# Patient Record
Sex: Male | Born: 1980 | Race: Black or African American | Hispanic: No | Marital: Single | Smoking: Current every day smoker
Health system: Southern US, Community
[De-identification: ages and names within clinical notes are randomized; demographics above are authoritative.]

## PROBLEM LIST (undated history)

## (undated) HISTORY — PX: APPENDECTOMY: SHX54

---

## 2004-10-05 ENCOUNTER — Emergency Department: Payer: Self-pay | Admitting: Unknown Physician Specialty

## 2006-07-18 ENCOUNTER — Emergency Department: Payer: Self-pay

## 2006-07-18 ENCOUNTER — Other Ambulatory Visit: Payer: Self-pay

## 2007-10-01 ENCOUNTER — Encounter (INDEPENDENT_AMBULATORY_CARE_PROVIDER_SITE_OTHER): Payer: Self-pay | Admitting: Internal Medicine

## 2007-10-01 ENCOUNTER — Ambulatory Visit: Payer: Self-pay | Admitting: Family Medicine

## 2007-10-01 DIAGNOSIS — B353 Tinea pedis: Secondary | ICD-10-CM

## 2007-10-06 LAB — CONVERTED CEMR LAB
Basophils Absolute: 0 10*3/uL (ref 0.0–0.1)
Calcium: 9.5 mg/dL (ref 8.4–10.5)
Chloride: 106 meq/L (ref 96–112)
Cholesterol: 200 mg/dL (ref 0–200)
Eosinophils Absolute: 0.2 10*3/uL (ref 0.0–0.6)
GFR calc non Af Amer: 87 mL/min
HDL: 36.1 mg/dL — ABNORMAL LOW (ref >39.0)
MCHC: 34.7 g/dL (ref 30.0–36.0)
MCV: 96.6 fL (ref 78.0–100.0)
Neutrophils Relative %: 43.5 % (ref 43.0–77.0)
Platelets: 171 10*3/uL (ref 150–400)
RBC: 4.64 M/uL (ref 4.22–5.81)
Sodium: 140 meq/L (ref 135–145)
Total CHOL/HDL Ratio: 5.5
Triglycerides: 86 mg/dL (ref 0–149)

## 2007-10-07 ENCOUNTER — Encounter (INDEPENDENT_AMBULATORY_CARE_PROVIDER_SITE_OTHER): Payer: Self-pay | Admitting: Internal Medicine

## 2007-10-07 LAB — CONVERTED CEMR LAB

## 2007-12-13 ENCOUNTER — Emergency Department: Payer: Self-pay | Admitting: Emergency Medicine

## 2009-08-25 ENCOUNTER — Inpatient Hospital Stay: Payer: Self-pay | Admitting: Surgery

## 2013-08-09 ENCOUNTER — Emergency Department: Payer: Self-pay | Admitting: Emergency Medicine

## 2013-08-23 ENCOUNTER — Emergency Department: Payer: Self-pay | Admitting: Emergency Medicine

## 2013-11-22 ENCOUNTER — Emergency Department: Payer: Self-pay | Admitting: Emergency Medicine

## 2013-11-22 LAB — RAPID INFLUENZA A&B ANTIGENS

## 2013-11-22 LAB — BASIC METABOLIC PANEL
Anion Gap: 4 — ABNORMAL LOW (ref 7–16)
Co2: 27 mmol/L (ref 21–32)
EGFR (African American): >60
Osmolality: 272 (ref 275–301)
Potassium: 3.6 mmol/L (ref 3.5–5.1)

## 2013-11-22 LAB — CBC
HCT: 42.6 % (ref 40.0–52.0)
MCH: 32.2 pg (ref 26.0–34.0)
MCV: 94 fL (ref 80–100)
RDW: 13.2 % (ref 11.5–14.5)
WBC: 7.3 10*3/uL (ref 3.8–10.6)

## 2013-11-22 LAB — TROPONIN I: Troponin-I: <0.02 ng/mL

## 2014-11-14 ENCOUNTER — Ambulatory Visit: Payer: Self-pay | Admitting: Physician Assistant

## 2014-11-24 ENCOUNTER — Ambulatory Visit: Payer: Self-pay | Admitting: Family Medicine

## 2016-02-20 ENCOUNTER — Ambulatory Visit
Admission: EM | Admit: 2016-02-20 | Discharge: 2016-02-20 | Disposition: A | Discharge status: Home / Self Care | Payer: Self-pay | Attending: Family Medicine | Admitting: Family Medicine

## 2016-02-20 DIAGNOSIS — B349 Viral infection, unspecified: Secondary | ICD-10-CM

## 2016-02-20 LAB — RAPID INFLUENZA A&B ANTIGENS
Influenza A (ARMC): NEGATIVE
Influenza B (ARMC): NEGATIVE

## 2016-02-20 MED ORDER — ONDANSETRON 4 MG PO TBDP: 4.0000 mg | ORAL_TABLET | Freq: Three times a day (TID) | ORAL | Status: DC | PRN

## 2016-02-20 MED ORDER — OSELTAMIVIR PHOSPHATE 75 MG PO CAPS: 75.0000 mg | ORAL_CAPSULE | Freq: Two times a day (BID) | ORAL | Status: DC

## 2016-02-20 NOTE — ED Provider Notes (Signed)
CSN: [id number]     Arrival date & time 02/20/16  1133 History   First MD Initiated Contact with Patient 02/20/16 1149     Chief Complaint  Patient presents with  . URI   (Consider location/radiation/quality/duration/timing/severity/associated sxs/prior Treatment) HPI: Patient presents today with symptoms of myalgias, vomiting, diarrhea, fever, cough for the last 3 days. He denies any chest pain, shortness of breath, abdominal pain, severe headache, rash, sore throat. He has taken over-the-counter medications for his symptoms. He states that he has likely been around people with the flu virus. No recent travel. Has been able to keep some fluids down.   History reviewed. No pertinent past medical history. Past Surgical History  Procedure Laterality Date  . Appendectomy     History reviewed. No pertinent family history. Social History  Substance Use Topics  . Smoking status: Current Every Day Smoker  . Smokeless tobacco: Never Used  . Alcohol Use: Yes    Review of Systems: Negative except mentioned above.  Allergies  Penicillins  Home Medications   Prior to Admission medications   Medication Sig Start Date End Date Taking? Authorizing Provider  ondansetron (ZOFRAN ODT) 4 MG disintegrating tablet Take 1 tablet (4 mg total) by mouth every 8 (eight) hours as needed for nausea or vomiting. 02/20/16   Jolene ProvostKirtida Masami Plata, MD  oseltamivir (TAMIFLU) 75 MG capsule Take 1 capsule (75 mg total) by mouth every 12 (twelve) hours. 02/20/16   Jolene ProvostKirtida Belkis Norbeck, MD   Meds Ordered and Administered this Visit  Medications - No data to display  BP 114/73 mmHg  Pulse 86  Temp(Src) 98.9 F (37.2 C) (Oral)  Resp 18  Ht 5\' 8"  (1.727 m)  Wt 170 lb (77.111 kg)  BMI 25.85 kg/m2  SpO2 98% No data found.   Physical Exam:  GENERAL: NAD HEENT: no pharyngeal erythema, no exudate, no erythema of TMs, no cervical LAD RESP: CTA B CARD: RRR ABD: +BS, NT/ND, no rebound or guarding appreciated  NEURO: CN  II-XII grossly intact   ED Course  Procedures (including critical care time)  Labs Review Labs Reviewed  RAPID INFLUENZA A&B ANTIGENS ([facility] ONLY)    Imaging Review No results found.    MDM   1. Viral illness   Influenza screen was negative in the office today. But given symptoms I have prescribed Tamiflu if the patient wants to take it. Have also sent over a prescription for Zofran ODT. Encouraged hydration and rest. Supportive care with other over-the-counter medications discussed. If unable to tolerate fluids or food for another day I would recommend that he seek immediate medical attention at the ER if needed. Work excuse given for today and tomorrow.    Jolene ProvostKirtida Lailah Marcelli, MD 02/20/16 [phone]

## 2016-02-20 NOTE — ED Notes (Signed)
Patient c/o body aches, vomiting, fever, and cough which started this past Saturday morning.

## 2016-02-27 ENCOUNTER — Emergency Department
Admission: EM | Admit: 2016-02-27 | Discharge: 2016-02-27 | Disposition: A | Discharge status: Home / Self Care | Payer: Self-pay | Attending: Emergency Medicine | Admitting: Emergency Medicine

## 2016-02-27 ENCOUNTER — Encounter: Payer: Self-pay | Admitting: Medical Oncology

## 2016-02-27 ENCOUNTER — Emergency Department: Payer: Self-pay

## 2016-02-27 DIAGNOSIS — F17200 Nicotine dependence, unspecified, uncomplicated: Secondary | ICD-10-CM | POA: Insufficient documentation

## 2016-02-27 DIAGNOSIS — J159 Unspecified bacterial pneumonia: Secondary | ICD-10-CM | POA: Insufficient documentation

## 2016-02-27 DIAGNOSIS — J189 Pneumonia, unspecified organism: Secondary | ICD-10-CM

## 2016-02-27 DIAGNOSIS — Z880 Allergy status to penicillin: Secondary | ICD-10-CM | POA: Insufficient documentation

## 2016-02-27 DIAGNOSIS — I319 Disease of pericardium, unspecified: Secondary | ICD-10-CM | POA: Insufficient documentation

## 2016-02-27 LAB — CBC WITH DIFFERENTIAL/PLATELET
BASOS ABS: 0 10*3/uL (ref 0–0.1)
Basophils Relative: 0 %
EOS ABS: 0.2 10*3/uL (ref 0–0.7)
Eosinophils Relative: 2 %
HCT: 43.4 % (ref 40.0–52.0)
HEMOGLOBIN: 14.7 g/dL (ref 13.0–18.0)
LYMPHS PCT: 23 %
Lymphs Abs: 2.6 10*3/uL (ref 1.0–3.6)
MCH: 31.8 pg (ref 26.0–34.0)
MCHC: 34 g/dL (ref 32.0–36.0)
MCV: 93.6 fL (ref 80.0–100.0)
Monocytes Absolute: 2.6 10*3/uL — ABNORMAL HIGH (ref 0.2–1.0)
Monocytes Relative: 23 %
NEUTROS ABS: 6 10*3/uL (ref 1.4–6.5)
NEUTROS PCT: 52 %
Platelets: 253 10*3/uL (ref 150–440)
RBC: 4.63 MIL/uL (ref 4.40–5.90)
RDW: 12.8 % (ref 11.5–14.5)
WBC: 11.4 10*3/uL — AB (ref 3.8–10.6)

## 2016-02-27 LAB — BASIC METABOLIC PANEL
Anion gap: 6 (ref 5–15)
BUN: 15 mg/dL (ref 6–20)
CO2: 25 mmol/L (ref 22–32)
CREATININE: 1.23 mg/dL (ref 0.61–1.24)
Calcium: 9.1 mg/dL (ref 8.9–10.3)
Chloride: 105 mmol/L (ref 101–111)
GFR calc Af Amer: >60 mL/min (ref >60)
Glucose, Bld: 93 mg/dL (ref 65–99)
POTASSIUM: 4.5 mmol/L (ref 3.5–5.1)
SODIUM: 136 mmol/L (ref 135–145)

## 2016-02-27 LAB — TROPONIN I

## 2016-02-27 MED ORDER — KETOROLAC TROMETHAMINE 30 MG/ML IJ SOLN
30.0000 mg | Freq: Once | INTRAMUSCULAR | Status: AC
  Administered 2016-02-27: 30 mg via INTRAVENOUS
  Filled 2016-02-27: qty 1

## 2016-02-27 MED ORDER — SODIUM CHLORIDE 0.9 % IV BOLUS (SEPSIS)
1000.0000 mL | Freq: Once | INTRAVENOUS | Status: AC
  Administered 2016-02-27: 1000 mL via INTRAVENOUS

## 2016-02-27 MED ORDER — AZITHROMYCIN 250 MG PO TABS: ORAL_TABLET | ORAL | Status: AC

## 2016-02-27 MED ORDER — IBUPROFEN 600 MG PO TABS: 600.0000 mg | ORAL_TABLET | Freq: Four times a day (QID) | ORAL | Status: DC

## 2016-02-27 NOTE — Discharge Instructions (Signed)
Pericarditis Pericarditis is swelling (inflammation) of the pericardium. The pericardium is a thin, double-layered, fluid-filled tissue sac that surrounds the heart. The purpose of the pericardium is to contain the heart in the chest cavity and keep the heart from overexpanding. Different types of pericarditis can occur, such as:  Acute pericarditis. Inflammation can develop suddenly in acute pericarditis.  Chronic pericarditis. Inflammation develops gradually and is long-lasting in chronic pericarditis.  Constrictive pericarditis. In this type of pericarditis, the layers of the pericardium stiffen and develop scar tissue. The scar tissue thickens and sticks together. This makes it difficult for the heart to pump and work as it normally does. CAUSES  Pericarditis can be caused from different conditions, such as:  A bacterial, fungal or viral infection.  After a heart attack (myocardial infarction).  After open-heart surgery (coronary bypass graft surgery).  Auto-immune conditions such as lupus, rheumatoid arthritis or scleroderma.  Kidney failure.  Low thyroid condition (hypothyroidism).  Cancer from another part of the body that has spread (metastasized) to the pericardium.  Chest injury or trauma.  After radiation treatment.  Certain medicines. SYMPTOMS  Symptoms of pericarditis can include:  Chest pain. Chest pain symptoms may increase when laying down and may be relieved when sitting up and leaning forward.  A chronic, dry cough.  Heart palpitations. These may feel like rapid, fluttering or pounding heart beats.  Chest pain may be worse when swallowing.  Dizziness or fainting.  Tiredness, fatigue or lethargy.  Fever. DIAGNOSIS  Pericarditis is diagnosed by the following:  A physical exam. A heart sound called a pericardial friction rub may be heard when your caregiver listens to your heart.  Blood work. Blood may be drawn to check for an infection and to look at  your blood chemistry.  Electrocardiography. During electrocardiography your heart's electrical activity is monitored and recorded with a tracing on paper (electrocardiogram [ECG]).  Echocardiography.  Computed tomography (CT).  Magnetic resonance image (MRI). TREATMENT  To treat pericarditis, it is important to know the cause of it. The cause of pericarditis determines the treatment.   If the cause of pericarditis is due to an infection, treatment is based on the type of infection. If an infection is suspected in the pericardial fluid, a procedure called a pericardial fluid culture and biopsy may be done. This takes a sample of the pericardial fluid. The sample is sent to a lab which runs tests on the pericardial fluid to check for an infection.  If the autoimmune disease is the cause, treatment of the autoimmune condition will help improve the pericarditis.  If the cause of pericarditis is not known, anti-inflammatory medicines may be used to help decrease the inflammation.  Surgery may be needed. The following are types of surgeries or procedures that may be done to treat pericarditis:  Pericardial window. A pericardial window makes a cut (incision) into the pericardial sac. This allows excess fluid in the pericardium to drain.  Pericardiocentesis. A pericardiocentesis is also known as a pericardial tap. This procedure uses a needle that is guided by X-ray to drain (aspirate) excess fluid from the pericardium.  Pericardiectomy. A pericardiectomy removes part or all of the pericardium. HOME CARE INSTRUCTIONS   Do not smoke. If you smoke, quit. Your caregiver can help you quit smoking.  Maintain a healthy weight.  Follow an exercise program as directed by your health care provider. You may need to limit your exercising until your symptoms go away.  If you drink alcohol, do so in moderation.  Eat a heart healthy diet. A registered dietitian can help you learn about healthy food  choices.  Keep a list of all your medicines with you at all times. Include the name, dose, how often it is taken and how it is taken. SEEK IMMEDIATE MEDICAL CARE IF:   You have chest pain or feelings of chest pressure.  You have sweating (diaphoresis) when at rest.  You have irregular heartbeats (palpitations).  You have rapid, racing heart beats.  You have unexplained fainting episodes.  You feel sick to your stomach (nausea) or vomiting without cause.  You have unexplained weakness. If you develop any of the symptoms which originally made you seek care, call for local emergency medical help. Do not drive yourself to the hospital.   This information is not intended to replace advice given to you by your health care provider. Make sure you discuss any questions you have with your health care provider.   Document Released: 05/20/2001 Document Revised: 04/10/2015 Document Reviewed: 06/06/2015 Elsevier Interactive Patient Education 2016 Elsevier Inc.  Community-Acquired Pneumonia, Adult Pneumonia is an infection of the lungs. One type of pneumonia can happen while a person is in a hospital. A different type can happen when a person is not in a hospital (community-acquired pneumonia). It is easy for this kind to spread from person to person. It can spread to you if you breathe near an infected person who coughs or sneezes. Some symptoms include:  A dry cough.  A wet (productive) cough.  Fever.  Sweating.  Chest pain. HOME CARE  Take over-the-counter and prescription medicines only as told by your doctor.  Only take cough medicine if you are losing sleep.  If you were prescribed an antibiotic medicine, take it as told by your doctor. Do not stop taking the antibiotic even if you start to feel better.  Sleep with your head and neck raised (elevated). You can do this by putting a few pillows under your head, or you can sleep in a recliner.  Do not use tobacco products. These  include cigarettes, chewing tobacco, and e-cigarettes. If you need help quitting, ask your doctor.  Drink enough water to keep your pee (urine) clear or pale yellow. A shot (vaccine) can help prevent pneumonia. Shots are often suggested for:  People older than 33 years of age.  People older than 33 years of age:  Who are having cancer treatment.  Who have long-term (chronic) lung disease.  Who have problems with their body's defense system (immune system). You may also prevent pneumonia if you take these actions:  Get the flu (influenza) shot every year.  Go to the dentist as often as told.  Wash your hands often. If soap and water are not available, use hand sanitizer. GET HELP IF:  You have a fever.  You lose sleep because your cough medicine does not help. GET HELP RIGHT AWAY IF:  You are short of breath and it gets worse.  You have more chest pain.  Your sickness gets worse. This is very serious if:  You are an older adult.  Your body's defense system is weak.  You cough up blood.   This information is not intended to replace advice given to you by your health care provider. Make sure you discuss any questions you have with your health care provider.   Document Released: 05/12/2008 Document Revised: 08/15/2015 Document Reviewed: 03/21/2015 Elsevier Interactive Patient Education [employer]2016 Elsevier Inc.

## 2016-02-27 NOTE — ED Provider Notes (Signed)
[facility] Emergency Department Provider Note  ____________________________________________  Time seen: Approximately 10:05 AM  I have reviewed the triage vital signs and the nursing notes.   HISTORY  Chief Complaint Chest Pain and Shortness of Breath    HPI Malik Hoffman is a 35 y.o. male without any chronic medical conditions was presenting to the emergency department today with left-sided, sharp chest pain which began yesterday and worsened this morning. He says he has had a cough over the course of the last week. Denies any fever over the last 2 days. However, has complained of fever, vomiting and diarrhea and myalgias on the 15th. He says that the chest pain as a 10 out of 10, sharp in the left side of the chest. It is associated with shortness of breath but no vomiting or nausea. He says it worsens with deep breathing. His that he is a smoking history but denies any drug use. Asked and Specifically denied cocaine. Denies any history of cardiac disease in his family. Denies any radiation of the pain.  History reviewed. No pertinent past medical history.  Patient Active Problem List   Diagnosis Date Noted  . TINEA PEDIS 10/01/2007    Past Surgical History  Procedure Laterality Date  . Appendectomy      Current Outpatient Rx  Name  Route  Sig  Dispense  Refill  . ondansetron (ZOFRAN ODT) 4 MG disintegrating tablet   Oral   Take 1 tablet (4 mg total) by mouth every 8 (eight) hours as needed for nausea or vomiting.   20 tablet   0   . oseltamivir (TAMIFLU) 75 MG capsule   Oral   Take 1 capsule (75 mg total) by mouth every 12 (twelve) hours.   10 capsule   0     Allergies Penicillins  No family history on file.  Social History Social History  Substance Use Topics  . Smoking status: Current Every Day Smoker  . Smokeless tobacco: Never Used  . Alcohol Use: Yes    Review of Systems Constitutional: No fever/chills Eyes: No  visual changes. ENT: No sore throat. Cardiovascular: As above Respiratory: As above Gastrointestinal: No abdominal pain.  No nausea, no vomiting.  No diarrhea.  No constipation. Genitourinary: Negative for dysuria. Musculoskeletal: Negative for back pain. Skin: Negative for rash. Neurological: Negative for headaches, focal weakness or numbness.  10-point ROS otherwise negative.  ____________________________________________   PHYSICAL EXAM:  VITAL SIGNS: ED Triage Vitals  Enc Vitals Group     BP 02/27/16 0957 119/89 mmHg     Pulse Rate 02/27/16 0953 86     Resp 02/27/16 0953 20     Temp 02/27/16 0954 98.2 F (36.8 C)     Temp Source 02/27/16 0954 Oral     SpO2 02/27/16 0953 99 %     Weight 02/27/16 0953 180 lb (81.647 kg)     Height 02/27/16 0953 5\' 8"  (1.727 m)     Head Cir --      Peak Flow --      Pain Score 02/27/16 0953 10     Pain Loc --      Pain Edu? --      Excl. in GC? --     Constitutional: Alert and oriented. Appears uncomfortable but not distressed. Eyes: Conjunctivae are normal. PERRL. EOMI. Head: Atraumatic. Nose: No congestion/rhinnorhea. Mouth/Throat: Mucous membranes are moist.  Neck: No stridor.   Cardiovascular: Normal rate, regular rhythm. Grossly normal heart sounds.  Good  peripheral circulation. Reproducible tenderness to palpation to the left side of the chest. Respiratory: Normal respiratory effort.  No retractions. Lungs CTAB. Gastrointestinal: Soft and nontender. No distention. No CVA tenderness. Musculoskeletal: No lower extremity tenderness nor edema.  No joint effusions. Neurologic:  Normal speech and language. No gross focal neurologic deficits are appreciated.  Skin:  Skin is warm, dry and intact. No rash noted. Psychiatric: Mood and affect are normal. Speech and behavior are normal.  ____________________________________________   LABS (all labs ordered are listed, but only abnormal results are displayed)  Labs Reviewed  CBC WITH  DIFFERENTIAL/PLATELET - Abnormal; Notable for the following:    WBC 11.4 (*)    Monocytes Absolute 2.6 (*)    All other components within normal limits  TROPONIN I  BASIC METABOLIC PANEL   ____________________________________________  EKG  ED ECG REPORT I, Arelia Longest, the attending physician, personally viewed and interpreted this ECG.   Date: 02/27/2016  EKG Time: 0954  Rate: 82  Rhythm: normal sinus rhythm  Axis: normal  Intervals:none  ST&T Change: Diffuse ST elevation with a concave morphology consistent with pericarditis.  ____________________________________________  RADIOLOGY   IMPRESSION: No pulmonary edema. There is subtle patchy airspace opacification in lingula. Early infiltrate cannot be excluded. Follow-up to resolution is recommended.   Electronically Signed By: Natasha Mead M.D. ____________________________________________   PROCEDURES   ____________________________________________   INITIAL IMPRESSION / ASSESSMENT AND PLAN / ED COURSE  Pertinent labs & imaging results that were available during my care of the patient were reviewed by me and considered in my medical decision making (see chart for details).  ----------------------------------------- 10:21 AM on 02/27/2016 -----------------------------------------  Discussed the case with Dr.Gollan of cardiology who agrees with the EKG being consistent with pericarditis. The patient has a very good story for pericarditis given his recent viral illness. We'll give anti-inflammatories and fluid and reassess. We'll check basic labs as well as a troponin.  ----------------------------------------- 12:09 PM on 02/27/2016 -----------------------------------------  Patient with pain relief at this time. Review the labs as well as imaging with the patient. Will be discharged home with antibiotics for possible early pneumonia. Feel the primary reason for his visit today and chest pain is  pericarditis. We discussed return precautions including any worsening or concerning symptoms, especially increased pain or shortness of breath. He'll be discharged home with ibuprofen as well as doxycycline. ____________________________________________   FINAL CLINICAL IMPRESSION(S) / ED DIAGNOSES  Pericarditis. CAP.    Myrna Blazer, MD 02/27/16 [phone]

## 2016-02-27 NOTE — ED Notes (Signed)
Pt reports he has had a cold x 1 week with cough. Pt reports yesterday he began having sob and pain to left side of chest with breathing.

## 2017-04-06 ENCOUNTER — Emergency Department
Admission: EM | Admit: 2017-04-06 | Discharge: 2017-04-06 | Disposition: A | Discharge status: Home / Self Care | Payer: Self-pay | Attending: Emergency Medicine | Admitting: Emergency Medicine

## 2017-04-06 DIAGNOSIS — L301 Dyshidrosis [pompholyx]: Secondary | ICD-10-CM | POA: Insufficient documentation

## 2017-04-06 DIAGNOSIS — F17200 Nicotine dependence, unspecified, uncomplicated: Secondary | ICD-10-CM | POA: Insufficient documentation

## 2017-04-06 LAB — BASIC METABOLIC PANEL
ANION GAP: 7 (ref 5–15)
BUN: 14 mg/dL (ref 6–20)
CALCIUM: 9.3 mg/dL (ref 8.9–10.3)
CHLORIDE: 106 mmol/L (ref 101–111)
CO2: 27 mmol/L (ref 22–32)
Creatinine, Ser: 1.53 mg/dL — ABNORMAL HIGH (ref 0.61–1.24)
GFR calc Af Amer: >60 mL/min (ref >60)
GFR calc non Af Amer: 58 mL/min — ABNORMAL LOW (ref >60)
GLUCOSE: 95 mg/dL (ref 65–99)
POTASSIUM: 4.2 mmol/L (ref 3.5–5.1)
Sodium: 140 mmol/L (ref 135–145)

## 2017-04-06 LAB — CBC WITH DIFFERENTIAL/PLATELET
BASOS PCT: 1 %
Basophils Absolute: 0 10*3/uL (ref 0–0.1)
EOS PCT: 3 %
Eosinophils Absolute: 0.3 10*3/uL (ref 0–0.7)
HCT: 43.6 % (ref 40.0–52.0)
Hemoglobin: 14.8 g/dL (ref 13.0–18.0)
Lymphocytes Relative: 19 %
Lymphs Abs: 1.8 10*3/uL (ref 1.0–3.6)
MCH: 32.1 pg (ref 26.0–34.0)
MCHC: 34 g/dL (ref 32.0–36.0)
MCV: 94.5 fL (ref 80.0–100.0)
MONO ABS: 1.3 10*3/uL — AB (ref 0.2–1.0)
MONOS PCT: 14 %
Neutro Abs: 6.1 10*3/uL (ref 1.4–6.5)
Neutrophils Relative %: 63 %
Platelets: 200 10*3/uL (ref 150–440)
RBC: 4.61 MIL/uL (ref 4.40–5.90)
RDW: 13.5 % (ref 11.5–14.5)
WBC: 9.6 10*3/uL (ref 3.8–10.6)

## 2017-04-06 MED ORDER — METHYLPREDNISOLONE 4 MG PO TBPK: ORAL_TABLET | ORAL | 0 refills | Status: DC

## 2017-04-06 MED ORDER — HYDROXYZINE HCL 50 MG PO TABS
50.0000 mg | ORAL_TABLET | Freq: Three times a day (TID) | ORAL | 0 refills | Status: AC | PRN
Start: 2017-04-06 — End: ?

## 2017-04-06 MED ORDER — DESOXIMETASONE 0.25 % EX CREA: 1.0000 "application " | TOPICAL_CREAM | Freq: Two times a day (BID) | CUTANEOUS | 2 refills | Status: AC

## 2017-04-06 MED ORDER — KETOROLAC TROMETHAMINE 30 MG/ML IJ SOLN
30.0000 mg | Freq: Once | INTRAMUSCULAR | Status: AC
  Administered 2017-04-06: 30 mg via INTRAVENOUS
  Filled 2017-04-06: qty 1

## 2017-04-06 MED ORDER — METHYLPREDNISOLONE SODIUM SUCC 125 MG IJ SOLR
125.0000 mg | Freq: Once | INTRAMUSCULAR | Status: AC
  Administered 2017-04-06: 125 mg via INTRAVENOUS
  Filled 2017-04-06: qty 2

## 2017-04-06 MED ORDER — DESOXIMETASONE 0.25 % EX CREA: 1.0000 "application " | TOPICAL_CREAM | Freq: Two times a day (BID) | CUTANEOUS | 0 refills | Status: AC

## 2017-04-06 NOTE — Discharge Instructions (Signed)
Take medications as directed

## 2017-04-06 NOTE — ED Triage Notes (Signed)
Pt to ED via POV with c/o  RT hand pain and swelling x 2-3 days. Pt denies any injury to area. Pt has swelling noted. Pt denies any fevers or n/v/d. Pt A&O x4.

## 2017-04-06 NOTE — ED Provider Notes (Signed)
[facility] Emergency Department Provider Note   ____________________________________________   None    (approximate)  I have reviewed the triage vital signs and the nursing notes.   HISTORY  Chief Complaint Hand Pain    HPI Deundra Bard Bahner is a 36 y.o. male patient complainright hand pain and swelling for 2-3 days. Patient has history of injury. Patient denies any fever associated this complaint. Patient denies nausea vomiting diarrhea. Patient rates his pain discomfort as 8/10. Patient describes pain as "achy". No palliative measures for this complaint. Patient state he is not inconstant contact me to irritation. Patient has a history of eczema.   No past medical history on file.  Patient Active Problem List   Diagnosis Date Noted  . TINEA PEDIS 10/01/2007    Past Surgical History:  Procedure Laterality Date  . APPENDECTOMY      Prior to Admission medications   Medication Sig Start Date End Date Taking? Authorizing Provider  desoximetasone (TOPICORT) 0.25 % cream Apply 1 application topically 2 (two) times daily. 04/06/17   Joni Reining, PA-C  hydrOXYzine (ATARAX/VISTARIL) 50 MG tablet Take 1 tablet (50 mg total) by mouth 3 (three) times daily as needed for itching. 04/06/17   Joni Reining, PA-C  ibuprofen (ADVIL,MOTRIN) 600 MG tablet Take 1 tablet (600 mg total) by mouth 4 (four) times daily. 02/27/16   Myrna Blazer, MD  methylPREDNISolone (MEDROL DOSEPAK) 4 MG TBPK tablet Take Tapered dose as directed 04/06/17   Joni Reining, PA-C  ondansetron (ZOFRAN ODT) 4 MG disintegrating tablet Take 1 tablet (4 mg total) by mouth every 8 (eight) hours as needed for nausea or vomiting. 02/20/16   Jolene Provost, MD  oseltamivir (TAMIFLU) 75 MG capsule Take 1 capsule (75 mg total) by mouth every 12 (twelve) hours. 02/20/16   Jolene Provost, MD    Allergies Penicillins  No family history on file.  Social History Social History  Substance  Use Topics  . Smoking status: Current Every Day Smoker  . Smokeless tobacco: Never Used  . Alcohol use Yes    Review of Systems  Constitutional: No fever/chills Eyes: No visual changes. ENT: No sore throat. Cardiovascular: Denies chest pain. Respiratory: Denies shortness of breath. Gastrointestinal: No abdominal pain.  No nausea, no vomiting.  No diarrhea.  No constipation. Genitourinary: Negative for dysuria. Musculoskeletal: Negative for back pain. Skin: Positive for rash bilateral hand Neurological: Negative for headaches, focal weakness or numbness.   ____________________________________________   PHYSICAL EXAM:  VITAL SIGNS: ED Triage Vitals [04/06/17 1253]  Enc Vitals Group     BP      Pulse      Resp      Temp      Temp src      SpO2      Weight 180 lb (81.6 kg)     Height  (1.702 m)     Head Circumference      Peak Flow      Pain Score 6     Pain Loc      Pain Edu?      Excl. in GC?     Constitutional: Alert and oriented. Well appearing and in no acute distress. Eyes: Conjunctivae are normal. PERRL. EOMI. Head: Atraumatic. Nose: No congestion/rhinnorhea. Mouth/Throat: Mucous membranes are moist.  Oropharynx non-erythematous. Neck: No stridor.  No cervical spine tenderness to palpation. Hematological/Lymphatic/Immunilogical: No cervical lymphadenopathy. Cardiovascular: Normal rate, regular rhythm. Grossly normal heart sounds.  Good peripheral circulation.  Respiratory: Normal respiratory effort.  No retractions. Lungs CTAB. Gastrointestinal: Soft and nontender. No distention. No abdominal bruits. No CVA tenderness. Musculoskeletal: No lower extremity tenderness nor edema.  No joint effusions. Neurologic:  Normal speech and language. No gross focal neurologic deficits are appreciated. No gait instability. Skin:  Skin is warm, dry and intact. Edema and vesicular lesions bilateral hand right greater than left. Psychiatric: Mood and affect are normal.  Speech and behavior are normal.  ____________________________________________   LABS (all labs ordered are listed, but only abnormal results are displayed)  Labs Reviewed  BASIC METABOLIC PANEL - Abnormal; Notable for the following:       Result Value   Creatinine, Ser 1.53 (*)    GFR calc non Af Amer 58 (*)    All other components within normal limits  CBC WITH DIFFERENTIAL/PLATELET - Abnormal; Notable for the following:    Monocytes Absolute 1.3 (*)    All other components within normal limits   ____________________________________________  EKG   ____________________________________________  RADIOLOGY   ____________________________________________   PROCEDURES  Procedure(s) performed: None  Procedures  Critical Care performed: No  ____________________________________________   INITIAL IMPRESSION / ASSESSMENT AND PLAN / ED COURSE  Pertinent labs & imaging results that were available during my care of the patient were reviewed by me and considered in my medical decision making (see chart for details).  Dyshidrotic eczema. Patient given discharge care instructions. Patient advised follow-up with dermatology for definitive treatment. He is given a work note for 2 days.      ____________________________________________   FINAL CLINICAL IMPRESSION(S) / ED DIAGNOSES  Final diagnoses:  Dyshidrotic eczema      NEW MEDICATIONS STARTED DURING THIS VISIT:  New Prescriptions   DESOXIMETASONE (TOPICORT) 0.25 % CREAM    Apply 1 application topically 2 (two) times daily.   HYDROXYZINE (ATARAX/VISTARIL) 50 MG TABLET    Take 1 tablet (50 mg total) by mouth 3 (three) times daily as needed for itching.   METHYLPREDNISOLONE (MEDROL DOSEPAK) 4 MG TBPK TABLET    Take Tapered dose as directed     Note:  This document was prepared using Dragon voice recognition software and may include unintentional dictation errors.    Joni Reining, PA-C 04/06/17 1553      Jene Every, MD 04/07/17 1040

## 2017-06-14 IMAGING — DX DG CHEST 1V
1 series · 1 of 1 positions shown · non-contrast
Comparison: 11/22/2013

CLINICAL DATA: Cold for 1 week, cough, left side chest pain

EXAM:
CHEST 1 VIEW

[chest ap]
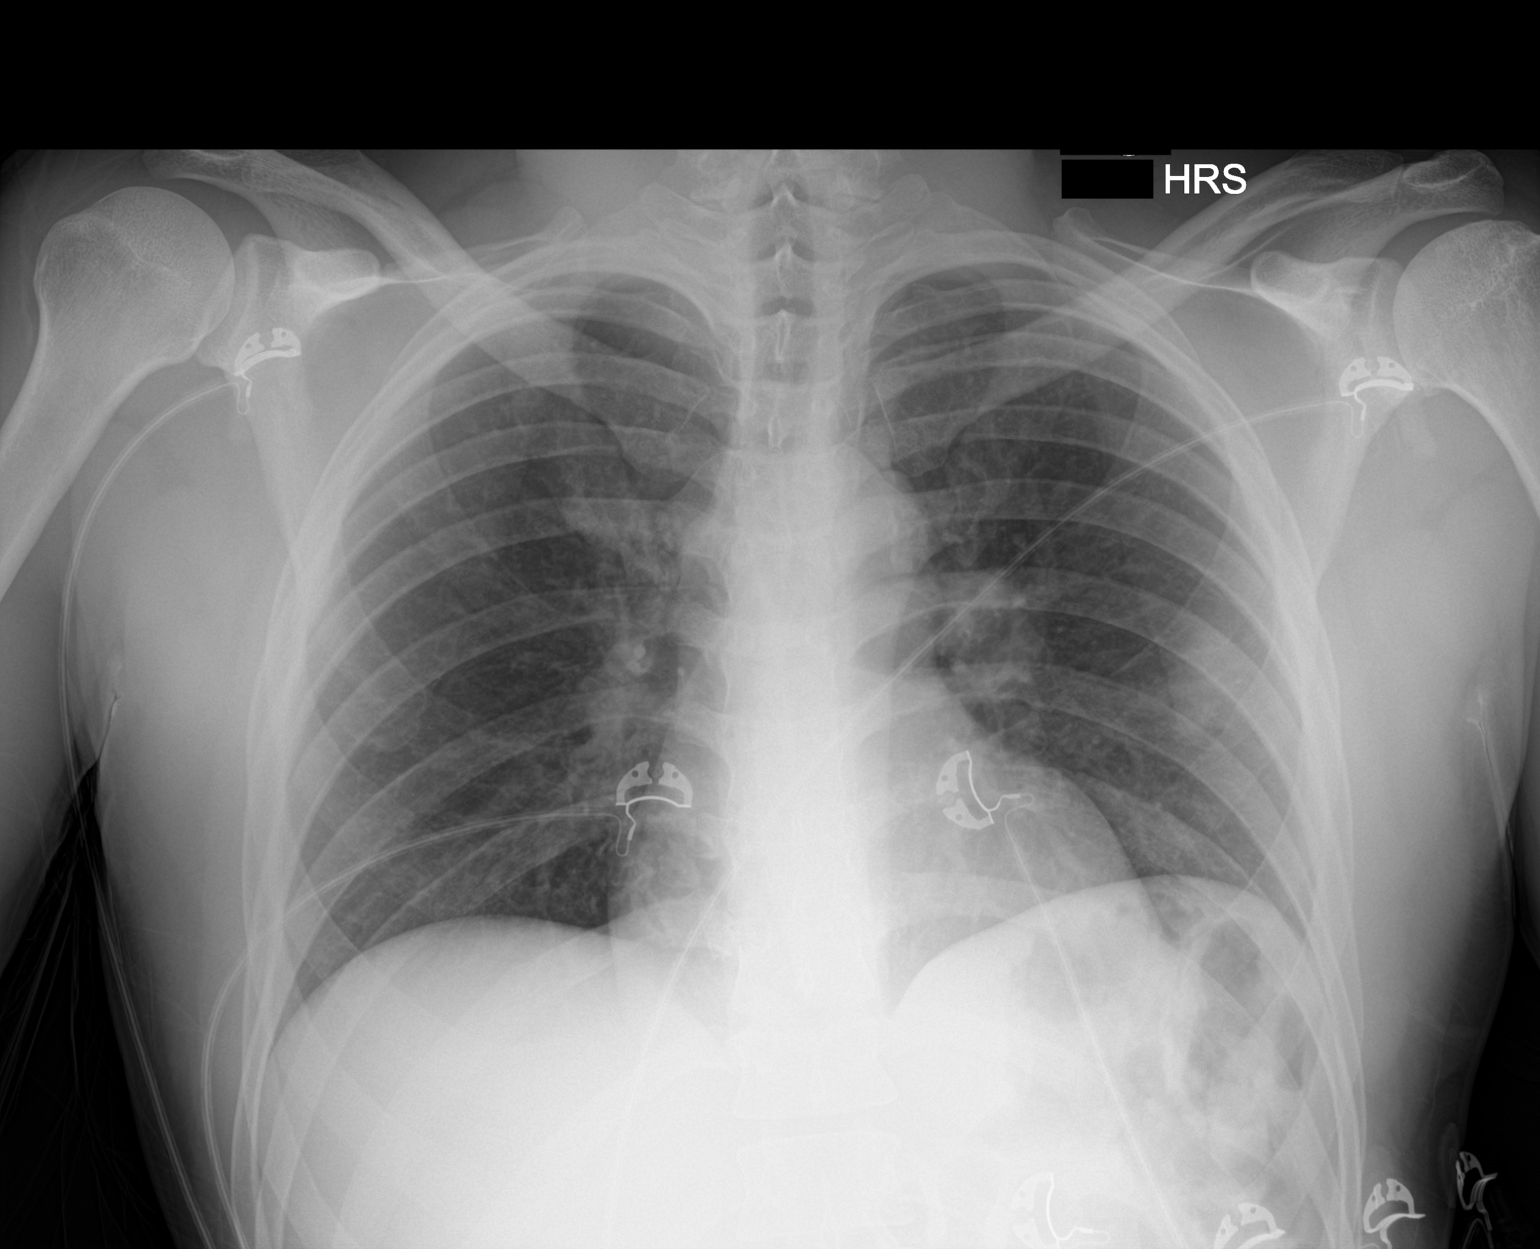

[1 of 1 positions shown; findings below may reference images not displayed]

FINDINGS: Cardiomediastinal silhouette is stable. No pulmonary edema. There is
subtle patchy airspace opacification in lingula. Early infiltrate
cannot be excluded. Follow-up to resolution is recommended.
IMPRESSION: No pulmonary edema. There is subtle patchy airspace opacification in
lingula. Early infiltrate cannot be excluded. Follow-up to
resolution is recommended.

## 2020-01-30 ENCOUNTER — Ambulatory Visit: Payer: Self-pay | Attending: Internal Medicine

## 2020-01-30 DIAGNOSIS — Z20822 Contact with and (suspected) exposure to covid-19: Secondary | ICD-10-CM | POA: Insufficient documentation

## 2020-01-31 LAB — NOVEL CORONAVIRUS, NAA: SARS-CoV-2, NAA: NOT DETECTED

## 2021-03-21 ENCOUNTER — Encounter: Payer: Self-pay | Admitting: Emergency Medicine

## 2021-03-21 ENCOUNTER — Emergency Department
Admission: EM | Admit: 2021-03-21 | Discharge: 2021-03-21 | Disposition: A | Discharge status: Home / Self Care | Payer: No Typology Code available for payment source | Attending: Physician Assistant | Admitting: Physician Assistant

## 2021-03-21 ENCOUNTER — Other Ambulatory Visit: Payer: Self-pay

## 2021-03-21 DIAGNOSIS — H10212 Acute toxic conjunctivitis, left eye: Secondary | ICD-10-CM | POA: Diagnosis not present

## 2021-03-21 DIAGNOSIS — X58XXXA Exposure to other specified factors, initial encounter: Secondary | ICD-10-CM | POA: Insufficient documentation

## 2021-03-21 DIAGNOSIS — L235 Allergic contact dermatitis due to other chemical products: Secondary | ICD-10-CM | POA: Insufficient documentation

## 2021-03-21 DIAGNOSIS — Y990 Civilian activity done for income or pay: Secondary | ICD-10-CM | POA: Insufficient documentation

## 2021-03-21 DIAGNOSIS — F17200 Nicotine dependence, unspecified, uncomplicated: Secondary | ICD-10-CM | POA: Diagnosis not present

## 2021-03-21 DIAGNOSIS — H5712 Ocular pain, left eye: Secondary | ICD-10-CM | POA: Diagnosis present

## 2021-03-21 DIAGNOSIS — T65891A Toxic effect of other specified substances, accidental (unintentional), initial encounter: Secondary | ICD-10-CM | POA: Diagnosis not present

## 2021-03-21 MED ORDER — HYDROXYZINE HCL 50 MG PO TABS: 50.0000 mg | ORAL_TABLET | Freq: Three times a day (TID) | ORAL | 0 refills | Status: AC | PRN

## 2021-03-21 MED ORDER — EYE WASH OPHTH SOLN
2.0000 [drp] | OPHTHALMIC | Status: DC | PRN
  Filled 2021-03-21: qty 118

## 2021-03-21 MED ORDER — METHYLPREDNISOLONE 4 MG PO TBPK: ORAL_TABLET | ORAL | 0 refills | Status: AC

## 2021-03-21 MED ORDER — NAPHAZOLINE-PHENIRAMINE 0.025-0.3 % OP SOLN: 1.0000 [drp] | Freq: Four times a day (QID) | OPHTHALMIC | 0 refills | Status: AC | PRN

## 2021-03-21 MED ORDER — GENTAMICIN SULFATE 0.3 % OP SOLN: 1.0000 [drp] | Freq: Four times a day (QID) | OPHTHALMIC | 0 refills | Status: AC

## 2021-03-21 MED ORDER — FLUORESCEIN SODIUM 1 MG OP STRP
1.0000 | ORAL_STRIP | Freq: Once | OPHTHALMIC | Status: AC
  Administered 2021-03-21: 1 via OPHTHALMIC
  Filled 2021-03-21: qty 1

## 2021-03-21 MED ORDER — TETRACAINE HCL 0.5 % OP SOLN
2.0000 [drp] | Freq: Once | OPHTHALMIC | Status: AC
  Administered 2021-03-21: 2 [drp] via OPHTHALMIC
  Filled 2021-03-21: qty 4

## 2021-03-21 NOTE — ED Notes (Signed)
Left eye irrigated with 500 ml of NS  Tolerated fairly well

## 2021-03-21 NOTE — ED Triage Notes (Signed)
Pt comes into the ED via POV c/o left eye pain after getting de-greaser in it at work.  Pt states he did flush the eye at work.  PT c/o burning sensation.  Pt in NAD with even and unlabored respirations.

## 2021-03-21 NOTE — ED Provider Notes (Signed)
[facility] ([place]) Emergency Department Provider Note   ____________________________________________   Event Date/Time   First MD Initiated Contact with Patient 03/21/21 [phone]     (approximate)  I have reviewed the triage vital signs and the nursing notes.   HISTORY  Chief Complaint Eye Pain    HPI [name] Stocking is a 40 y.o. male patient presents with pain secondary to being displaced by de-greaser.  Patient attempted to flush I work and follow-up success.  Patient described pain as "burning sensation".  Rates pain as a 9/10.         History reviewed. No pertinent past medical history.  Patient Active Problem List   Diagnosis Date Noted  . TINEA PEDIS 10/01/2007    Past Surgical History:  Procedure Laterality Date  . APPENDECTOMY      Prior to Admission medications   Medication Sig Start Date End Date Taking? Authorizing Provider  gentamicin (GARAMYCIN) 0.3 % ophthalmic solution Place 1 drop into the left eye 4 (four) times daily. 03/21/21  Yes Joni Reining, PA-C  hydrOXYzine (ATARAX/VISTARIL) 50 MG tablet Take 1 tablet (50 mg total) by mouth 3 (three) times daily as needed for itching. 03/21/21  Yes Joni Reining, PA-C  methylPREDNISolone (MEDROL DOSEPAK) 4 MG TBPK tablet Take Tapered dose as directed 03/21/21  Yes Joni Reining, PA-C  naphazoline-pheniramine (NAPHCON-A) 0.025-0.3 % ophthalmic solution Place 1 drop into the left eye 4 (four) times daily as needed for eye irritation. 03/21/21  Yes Joni Reining, PA-C  desoximetasone (TOPICORT) 0.25 % cream Apply 1 application topically 2 (two) times daily. 04/06/17   Joni Reining, PA-C  desoximetasone (TOPICORT) 0.25 % cream Apply 1 application topically 2 (two) times daily. 04/06/17   Joni Reining, PA-C  hydrOXYzine (ATARAX/VISTARIL) 50 MG tablet Take 1 tablet (50 mg total) by mouth 3 (three) times daily as needed for itching. 04/06/17   Joni Reining, PA-C     Allergies Penicillins  History reviewed. No pertinent family history.  Social History Social History   Tobacco Use  . Smoking status: Current Every Day Smoker  . Smokeless tobacco: Never Used  Substance Use Topics  . Alcohol use: Yes    Review of Systems Constitutional: No fever/chills Eyes: No visual changes.  Left eye pain. ENT: No sore throat. Cardiovascular: Denies chest pain. Respiratory: Denies shortness of breath. Gastrointestinal: No abdominal pain.  No nausea, no vomiting.  No diarrhea.  No constipation. Genitourinary: Negative for dysuria. Musculoskeletal: Negative for back pain. Skin: Negative for rash. Neurological: Negative for headaches, focal weakness or numbness. Allergic/Immunilogical: Penicillin ____________________________________________   PHYSICAL EXAM:  VITAL SIGNS: ED Triage Vitals  Enc Vitals Group     BP 03/21/21 0735 132/88     Pulse Rate 03/21/21 0735 100     Resp 03/21/21 0735 20     Temp 03/21/21 0735 98.6 F (37 C)     Temp Source 03/21/21 0735 Oral     SpO2 03/21/21 0735 97 %     Weight 03/21/21 0734 160 lb (72.6 kg)     Height 03/21/21 0734 5\' 8"  (1.727 m)     Head Circumference --      Peak Flow --      Pain Score 03/21/21 0734 9     Pain Loc --      Pain Edu? --      Excl. in GC? --    Constitutional: Alert and oriented. Well appearing and in no acute  distress.  Anxious. Eyes: Conjunctivae are normal. PERRL. EOMI. see nurses note for visual acuity. Head: Atraumatic. Nose: No congestion/rhinnorhea. Mouth/Throat: Mucous membranes are moist.  Oropharynx non-erythematous. Cardiovascular: Normal rate, regular rhythm. Grossly normal heart sounds.  Good peripheral circulation. Respiratory: Normal respiratory effort.  No retractions. Lungs CTAB. Neurologic:  Normal speech and language. No gross focal neurologic deficits are appreciated. No gait instability. Skin:  Skin is warm, dry and intact. No rash noted. Psychiatric: Mood  and affect are normal. Speech and behavior are normal.  ____________________________________________   LABS (all labs ordered are listed, but only abnormal results are displayed)  Labs Reviewed - No data to display ____________________________________________  EKG   ____________________________________________  RADIOLOGY I, Joni Reining, personally viewed and evaluated these images (plain radiographs) as part of my medical decision making, as well as reviewing the written report by the radiologist.  ED MD interpretation:    Official radiology report(s): No results found.  ____________________________________________   PROCEDURES  Procedure(s) performed (including Critical Care):  Procedures   ____________________________________________   INITIAL IMPRESSION / ASSESSMENT AND PLAN / ED COURSE  As part of my medical decision making, I reviewed the following data within the electronic MEDICAL RECORD NUMBER         Patient presents with left eye pain secondary to being splashed by degreaser at work.  Patient stated remarkable provement status post [facility] lens irrigation.  Patient given discharge care instruction advised take medication as directed.  Patient advised to follow ophthalmology in 3 to 4 days if no improvement.  Return to ED if condition worsens.      ____________________________________________   FINAL CLINICAL IMPRESSION(S) / ED DIAGNOSES  Final diagnoses:  Chemical conjunctivitis of left eye  Allergic dermatitis due to other chemical product     ED Discharge Orders         Ordered    naphazoline-pheniramine (NAPHCON-A) 0.025-0.3 % ophthalmic solution  4 times daily PRN        03/21/21 0845    gentamicin (GARAMYCIN) 0.3 % ophthalmic solution  4 times daily        03/21/21 0845    methylPREDNISolone (MEDROL DOSEPAK) 4 MG TBPK tablet        03/21/21 0845    hydrOXYzine (ATARAX/VISTARIL) 50 MG tablet  3 times daily PRN        03/21/21 0845           *Please note:  Malik Hoffman was evaluated in Emergency Department on 03/21/2021 for the symptoms described in the history of present illness. He was evaluated in the context of the global COVID-19 pandemic, which necessitated consideration that the patient might be at risk for infection with the SARS-CoV-2 virus that causes COVID-19. Institutional protocols and algorithms that pertain to the evaluation of patients at risk for COVID-19 are in a state of rapid change based on information released by regulatory bodies including the CDC and federal and state organizations. These policies and algorithms were followed during the patient's care in the ED.  Some ED evaluations and interventions may be delayed as a result of limited staffing during and the pandemic.*   Note:  This document was prepared using Dragon voice recognition software and may include unintentional dictation errors.    Joni Reining, PA-C 03/21/21 0848    Willy Eddy, MD 03/21/21 [phone]

## 2024-09-05 ENCOUNTER — Ambulatory Visit: Payer: Self-pay

## 2024-09-05 ENCOUNTER — Telehealth: Payer: Self-pay | Admitting: Family Medicine

## 2024-09-05 NOTE — Telephone Encounter (Signed)
 FYI Only or Action Required?: FYI only for provider.  Called Nurse Triage reporting Abscess.  Symptoms began several weeks ago.  Interventions attempted: Other: has been evaluated at urgent care.  Symptoms are: gradually improving.  Triage Disposition: See Physician Within 24 Hours  Patient/caregiver understands and will follow disposition?: Yes with modifications     Copied from CRM #[id number]. Topic: Clinical - Red Word Triage >> Sep 05, 2024  2:10 PM Fonda T wrote: [facility] that prompted transfer to Nurse Triage: Patient is calling, stats he thinks he may have an infection on both legs, with pus filled bumps, causing numbness and tinging and difficulty walking.     Reason for Disposition  2 or more boils  Answer Assessment - Initial Assessment Questions Patient reports his symptoms are improving. Patient recently seen at urgent care for the same. New patient appointment scheduled for 10/6    1. APPEARANCE of BOIL: What does the boil look like?      Pus filled with some draining  2. LOCATION: Where is the boil located?      Bilateral legs and scalp 3. NUMBER: How many boils are there?      It's quite a few 4. SIZE: How big is the boil? (e.g., inches, cm; compare to size of a coin or other object)     Small 5. ONSET: When did the boil start?     About 3 weeks  6. PAIN: Is there any pain? If Yes, ask: How bad is the pain?   (Scale 1-10; or mild, moderate, severe)     No 7. FEVER: Do you have a fever? If Yes, ask: What is it, how was it measured, and when did it start?      No 8. SOURCE: Have you been around anyone with boils or other Staph infections? Have you ever had boils before?     Unsure  9. OTHER SYMPTOMS: Do you have any other symptoms? (e.g., shaking chills, weakness, rash elsewhere on body)     No  Protocols used: Boil (Skin Abscess)-A-AH

## 2024-09-05 NOTE — Telephone Encounter (Signed)
 Mychart message sent. Appt. for 09/12/24 has been cancelled. Please reschedule.

## 2024-09-12 ENCOUNTER — Ambulatory Visit: Admitting: Family Medicine

## 2024-11-24 ENCOUNTER — Ambulatory Visit

## 2024-11-24 DIAGNOSIS — L663 Perifolliculitis capitis abscedens: Secondary | ICD-10-CM

## 2024-11-24 DIAGNOSIS — L219 Seborrheic dermatitis, unspecified: Secondary | ICD-10-CM

## 2024-11-24 MED ORDER — KETOCONAZOLE 2 % EX SHAM: MEDICATED_SHAMPOO | CUTANEOUS | 5 refills | Status: AC

## 2024-11-24 MED ORDER — CLOBETASOL PROPIONATE 0.05 % EX SOLN: CUTANEOUS | 5 refills | Status: AC

## 2024-11-24 NOTE — Patient Instructions (Signed)

## 2024-11-24 NOTE — Progress Notes (Signed)
°  °  Subjective   Malik Hoffman is a 43 y.o. male who presents for the following: Rash. Patient is new patient  Today patient reports: Rash in the scalp for the past several months. Patient was given a prescription shampoo by his PCP but is unsure what it was.   Review of Systems:    No other skin or systemic complaints except as noted in HPI or Assessment and Plan.  The following portions of the chart were reviewed this encounter and updated as appropriate: medications, allergies, medical history  Relevant Medical History:  n/a   Objective  (SKPE) Well appearing patient in no apparent distress; mood and affect are within normal limits. Examination was performed of the: Focused Exam of: scalp   Examination notable for: Seborrheic Dermatitis: Erythema and scaling of the scalp, ear canals, and central face > rest of face.  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 Condition is symptomatic and bothersome to patient. Patient is flaring and not currently at treatment goal.  - Discussed diagnosis, typical course, and treatment options for this condition - Explained to the patient the chronic nature of this diagnosis - Start ketoconazole  2% shampoo TIW, apply to scalp and affected areas of face/body and allow the shampoo to sit for 5-10 minutes before rinsing off - Consider alternating with use of Head and Shoulders or [place] Blue anti-dandruff shampoo which contains zinc pyrithione 1% - Start doxycycline 100 mg bid x 3 mo  - Discussed side effects and precautions with doxycycline including taking with meal, waiting at least 30 minutes before lying down at night, increased sun sensitivity, and to stop medication if becomes pregnant or breastfeeding Start clobetasol  0.05% ointment twice  daily as an adjunct to antibiotic therapy Counseled to use for 2 weeks and then to not use for 2 weeks prior to starting next cycle Discussed side effect of super potent topical steroids including atrophy, dyspigmentation, striae, telangectasia, folliculitis, loss of skin pigment, hair growth, tachyphylaxis, risk of systemic absorption with missuse.  Procedures, orders, diagnosis for this visit:    There are no diagnoses linked to this encounter.  Return to clinic: Return in about 8 weeks (around 01/19/2025) for Seb derm.  I, Emerick Ege, CMA am acting as scribe for Lauraine JAYSON Kanaris, MD.   Documentation: I have reviewed the above documentation for accuracy and completeness, and I agree with the above.  Lauraine JAYSON Kanaris, MD

## 2025-01-23 ENCOUNTER — Ambulatory Visit
